# Patient Record
Sex: Male | Born: 1980 | Race: White | Hispanic: No | Marital: Married | Smoking: Never smoker
Health system: Southern US, Community
[De-identification: ages and names within clinical notes are randomized; demographics above are authoritative.]

## PROBLEM LIST (undated history)

## (undated) HISTORY — PX: WISDOM TOOTH EXTRACTION: SHX21

## (undated) HISTORY — PX: CHOLESTEATOMA EXCISION: SHX1345

---

## 2012-11-15 ENCOUNTER — Other Ambulatory Visit: Payer: Self-pay | Admitting: Family Medicine

## 2012-11-15 DIAGNOSIS — M542 Cervicalgia: Secondary | ICD-10-CM

## 2012-11-16 ENCOUNTER — Ambulatory Visit
Admission: RE | Admit: 2012-11-16 | Discharge: 2012-11-16 | Disposition: A | Discharge status: Home / Self Care | Payer: BC Managed Care – PPO | Source: Ambulatory Visit | Attending: Family Medicine | Admitting: Family Medicine

## 2012-11-16 DIAGNOSIS — M542 Cervicalgia: Secondary | ICD-10-CM

## 2012-11-24 ENCOUNTER — Encounter
Payer: BC Managed Care – PPO | Attending: Physical Medicine & Rehabilitation | Admitting: Physical Medicine & Rehabilitation

## 2012-11-24 ENCOUNTER — Encounter: Payer: Self-pay | Admitting: Physical Medicine & Rehabilitation

## 2012-11-24 VITALS — BP 112/76 | HR 69 | Resp 14 | Ht 67.0 in | Wt 228.0 lb

## 2012-11-24 DIAGNOSIS — IMO0001 Reserved for inherently not codable concepts without codable children: Secondary | ICD-10-CM

## 2012-11-24 DIAGNOSIS — G4700 Insomnia, unspecified: Secondary | ICD-10-CM | POA: Insufficient documentation

## 2012-11-24 DIAGNOSIS — R52 Pain, unspecified: Secondary | ICD-10-CM | POA: Insufficient documentation

## 2012-11-24 DIAGNOSIS — M62838 Other muscle spasm: Secondary | ICD-10-CM | POA: Insufficient documentation

## 2012-11-24 DIAGNOSIS — Z5181 Encounter for therapeutic drug level monitoring: Secondary | ICD-10-CM

## 2012-11-24 DIAGNOSIS — R5381 Other malaise: Secondary | ICD-10-CM

## 2012-11-24 DIAGNOSIS — Q232 Congenital mitral stenosis: Secondary | ICD-10-CM

## 2012-11-24 DIAGNOSIS — R531 Weakness: Secondary | ICD-10-CM

## 2012-11-24 DIAGNOSIS — R209 Unspecified disturbances of skin sensation: Secondary | ICD-10-CM | POA: Insufficient documentation

## 2012-11-24 DIAGNOSIS — M797 Fibromyalgia: Secondary | ICD-10-CM

## 2012-11-24 MED ORDER — METHOCARBAMOL 500 MG PO TABS: 500.0000 mg | ORAL_TABLET | Freq: Four times a day (QID) | ORAL | Status: AC | PRN

## 2012-11-24 NOTE — Patient Instructions (Addendum)
PLEASE TRY MELATONIN FOR SLEEP----3-10MG  BEFORE SLEEP   PLEASE TRY DHEA---25MG  CAPSULE--TAKE EACH MORNING   WORK ON BASIC STRETCHING AND AEROBIC EXERCISE.

## 2012-11-24 NOTE — Progress Notes (Signed)
Subjective:    Patient ID: Dale Gonzales, male    DOB: 10-30-1981, 31 y.o.   MRN: [id number]  HPI This is a pleasant 31 yo WM who notes that he's had pain in various areas of body, probably dating back to 2004.  He has noted at times "knots". He has also noted numbness in his hands as well. The duration of symptoms varies. The pain has lasted from minutes to a few days. The numbness has lasted only a few minutes at a time. He also has noticed spasms as well occuring in the arms, legs, chest, and even the face. The spasms has varied in intensity over the years lasting from seconds to minutes although he had one over the weekend in his right quad which lasted for 3 hours. There is no particular time of day the spasms or these pain symptoms present. He is not taking any medications or using any modalities. He usually just "waits these out."  He was hospitalized earlier this month with weakness, numbness in his left arm and face. He also reports a decrease in taste. Work up was normal. An MRI was performed and was normal. i reviewed the MRI personally today. He also had normal carotid dopplers recently.  He has multiple cholesteatomas requiring numerous surgeries, the last of which on his left ear, performed on 11/19. He has had hearing loss as a result. He has had on occasion vestibular sx.  He has had problems with sleep for some time. He can't attribute it to anything specifically--sometimes it's pain, sometimes he's restless. He's lucky if he gets more than 4 hours per night.  He is only taking benadryl to help with congestion in his ears.  Exercise is minimal. 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 General activity 3 Relation with others 3 Enjoyment of life 3 What TIME of day is  your pain at its worst? morning and night Sleep (in general) Fair  Pain is worse with: unknown Pain improves with: over time Relief from Meds: 3  Mobility walk without assistance how many minutes can you walk? varies ability to climb steps?  yes do you drive?  yes Do you have any goals in this area?  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  no  Neuro/Psych bladder control problems weakness numbness tingling trouble walking spasms dizziness depression anxiety loss of taste or smell  Prior Studies n/a  Physicians involved in your care n/a   Family History  Problem Relation Age of Onset  . Mental illness Mother   . Mental illness Father    History   Social History  . Marital Status: Married    Spouse Name: N/A    Number of Children: N/A  . Years of Education: N/A   Social History Main Topics  . Smoking status: Never Smoker   . Smokeless tobacco: Never Used  . Alcohol Use: Yes  . Drug Use: No  . Sexually Active: None   Other Topics Concern  . None   Social History Narrative  . None   Past Surgical History  Procedure Date  . Wisdom tooth extraction   . Cholesteatoma excision    History reviewed. No pertinent  past medical history. BP 112/76  Pulse 69  Resp 14  Ht 5\' 7"  (1.702 m)  Wt 228 lb (103.42 kg)  BMI 35.71 kg/m2  SpO2 96%     Review of Systems  Musculoskeletal: Positive for gait problem.  Neurological: Positive for dizziness, weakness and numbness.  Psychiatric/Behavioral: Positive for dysphoric mood. The patient is nervous/anxious.   All other systems reviewed and are negative.       Objective:   Physical Exam  General: Alert and oriented x 3, No apparent distress, slightly overweight HEENT: Head is normocephalic, atraumatic, PERRLA, EOMI, sclera anicteric, oral mucosa pink and moist, dentition intact, ext ear canals clear,  Neck: Supple without JVD or lymphadenopathy Heart:  Reg rate and rhythm. No murmurs rubs or gallops Chest: CTA bilaterally without wheezes, rales, or rhonchi; no distress Abdomen: Soft, non-tender, non-distended, bowel sounds positive. Extremities: No clubbing, cyanosis, or edema. Pulses are 2+ Skin: Clean and intact without signs of breakdown Neuro: Pt is cognitively appropriate with normal insight, memory, and awareness. Cranial nerves 2-12 are intact except for hearing which was severely impaired on the left and mildly imparied on the right.he may have had a beat or 2 of nystagmus with right gaze. Sensory exam is normal to PP and LT.  Reflexes are 2+ in all 4's. Fine motor coordination is intact although he did have mild difficulty with heel to toe.. No tremors. Motor function is grossly 5/5. Musculoskeletal: He had some tightness diffusely in the lumbar paraspinals and low back as well as the cervical/shoulder girdle area. He had tenderness at the right greater than left elbow, over the clavicles, bilateral traps, medial femoral condyles. No joint swelling was appreciated. Posture was fair. He could not touch his toes.   Psych: Pt's affect is bright and appropriate. Pt is cooperative.        Assessment & Plan:  1. Diffuse body pain with muscle spasms and paresthesias, insomnia--most consistent with fibromyalgia: - recommended MRI of the cervical spine to rule out any further central causes of symptomatolgy although I don't feel this is MS -trial of melatonin for sleep (3-10mg qhs)--consider trazodone, i feel this is his most important problem -robaxin for muscle spasms 500mg  q6 prn -aerobic exercises are paramount. Also discussed regular stretching from head to toe. I think PT would be ideal for this, but I'm not sure his schedule will accommodate. I did provide him basic pilates exercises as a start. Recommended aerobic acitivity at least 3 days per week -follow up with me in 6 weeks.

## 2012-12-06 ENCOUNTER — Telehealth: Payer: Self-pay | Admitting: Physical Medicine & Rehabilitation

## 2012-12-22 ENCOUNTER — Encounter: Payer: BC Managed Care – PPO | Admitting: Physical Medicine & Rehabilitation

## 2013-01-05 ENCOUNTER — Ambulatory Visit: Payer: BC Managed Care – PPO | Admitting: Physical Medicine & Rehabilitation

## 2014-04-19 IMAGING — US US CAROTID DUPLEX BILAT
1 series · 13 of 24 positions shown · non-contrast
Comparison: None available

CLINICAL DATA: Syncope, bilateral neck pain right greater than left

BILATERAL CAROTID DUPLEX ULTRASOUND
TECHNIQUE: Gray scale imaging, color Doppler and duplex ultrasound
was performed of bilateral carotid and vertebral arteries in the
neck.

[Series 1: us carotid duplex bilat · 0.07mm/px · 13 of 54 slices shown]
[im 1/54]
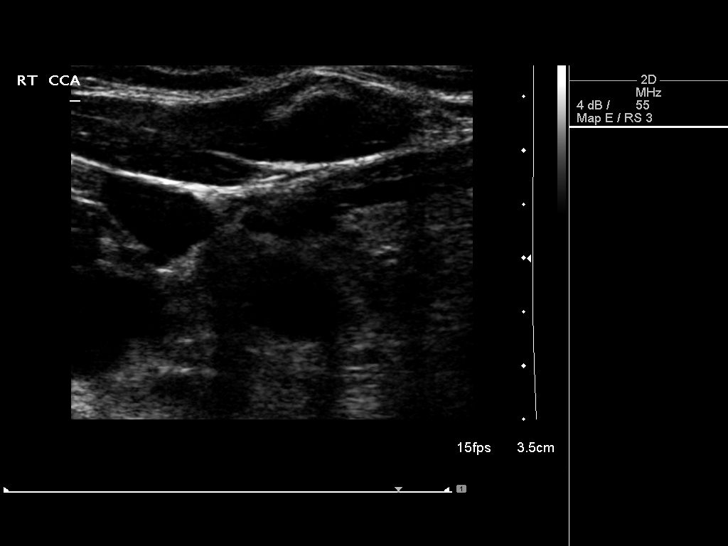
[im 5/54]
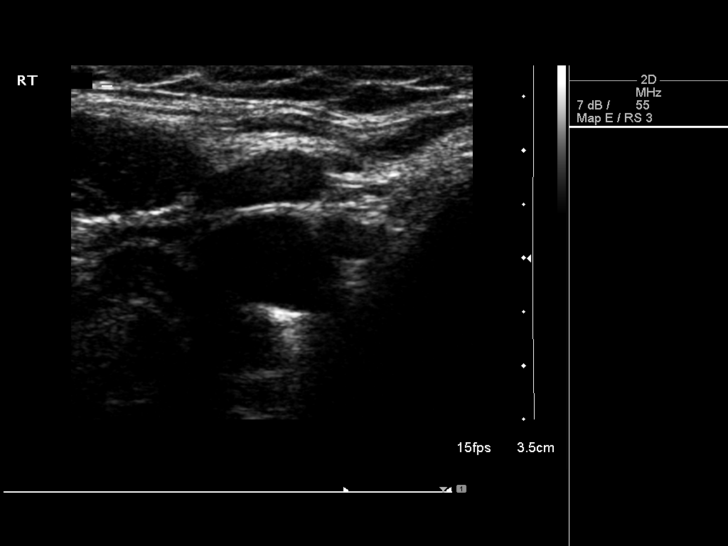
[im 10/54]
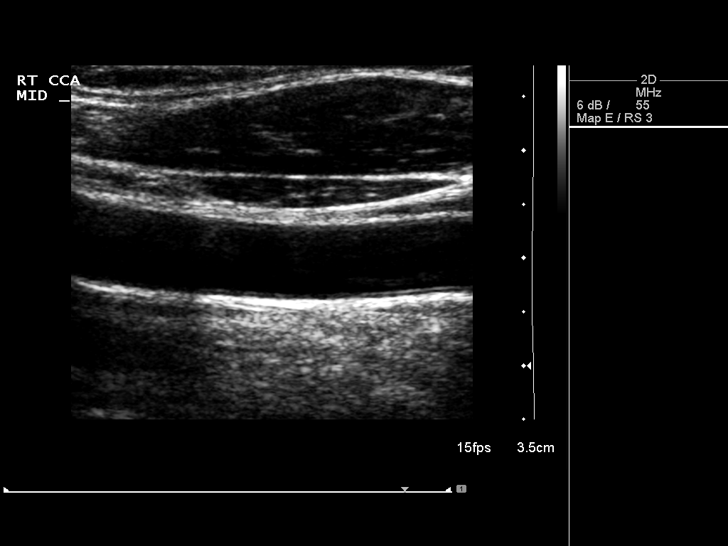
[im 14/54]
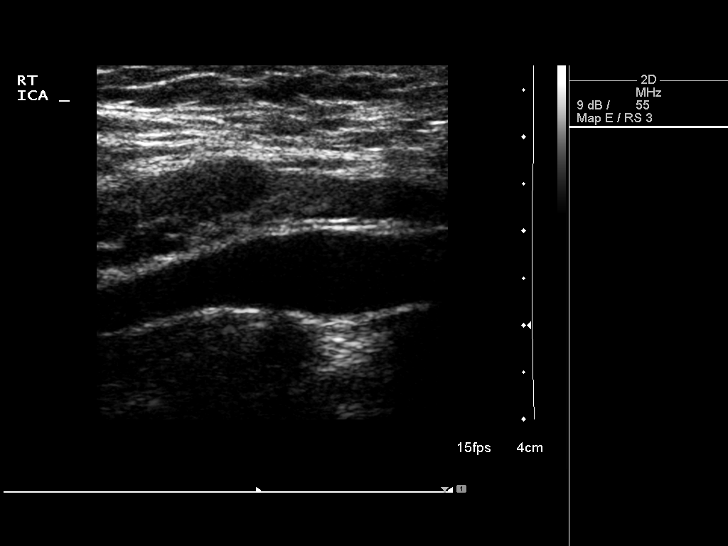
[im 19/54]
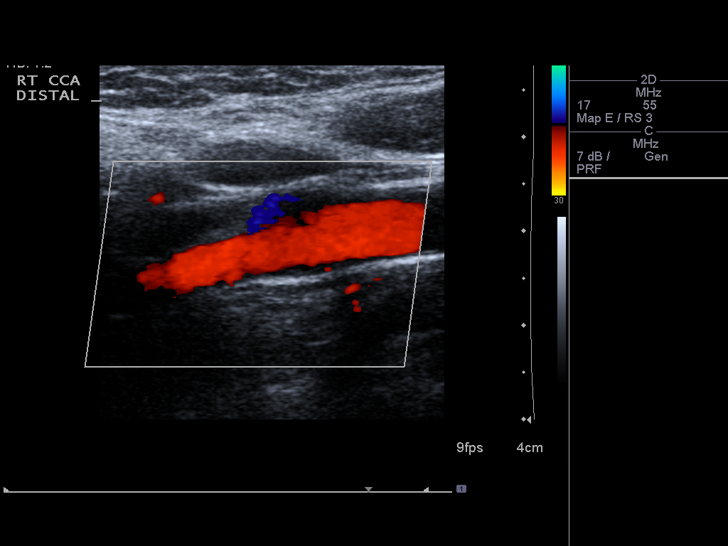
[im 24/54]
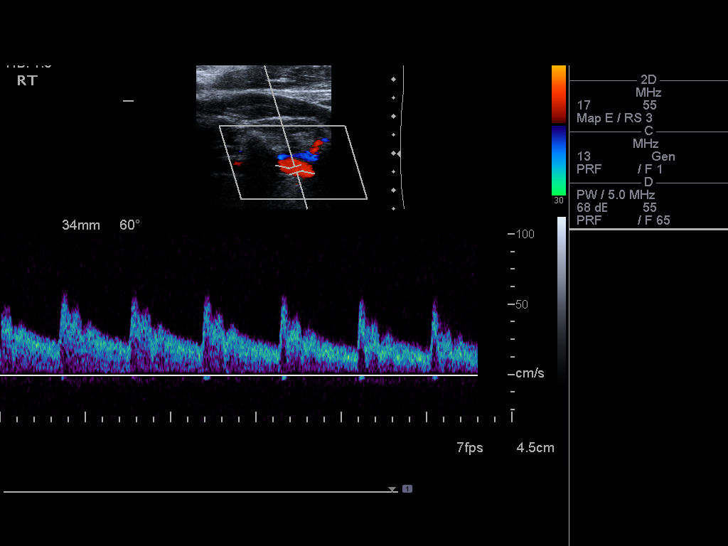
[im 28/54]
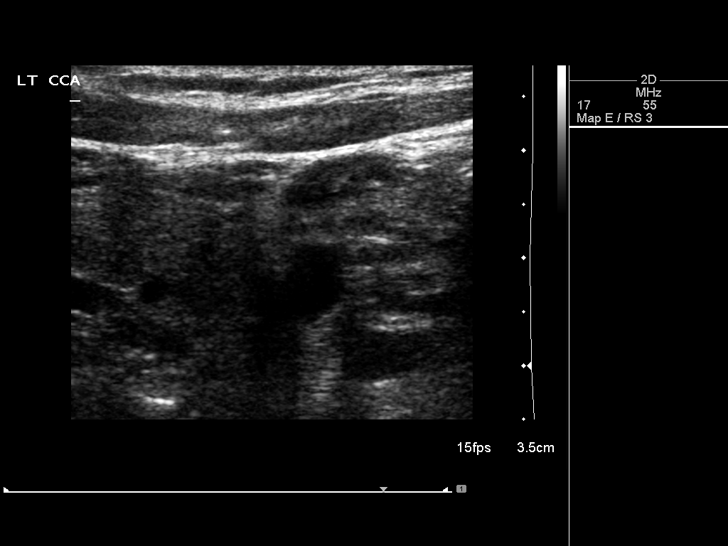
[im 30/54]
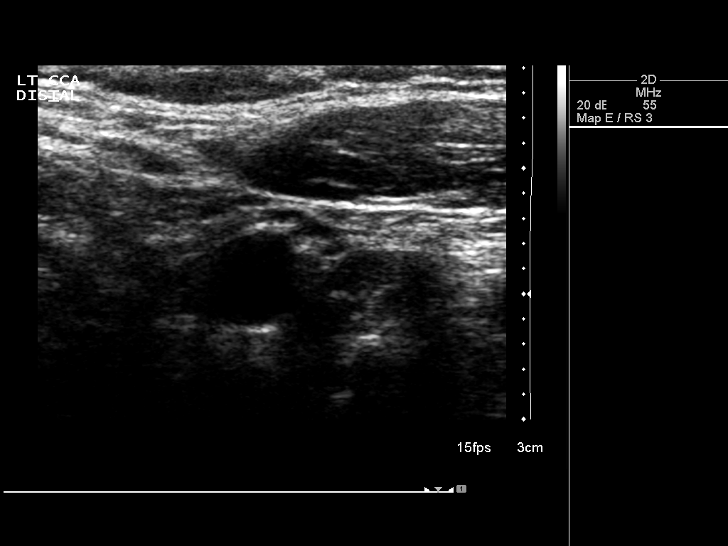
[im 35/54]
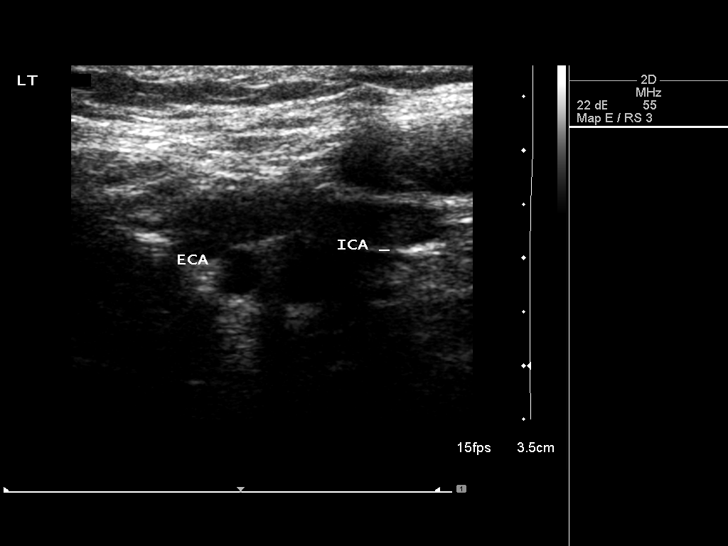
[im 40/54]
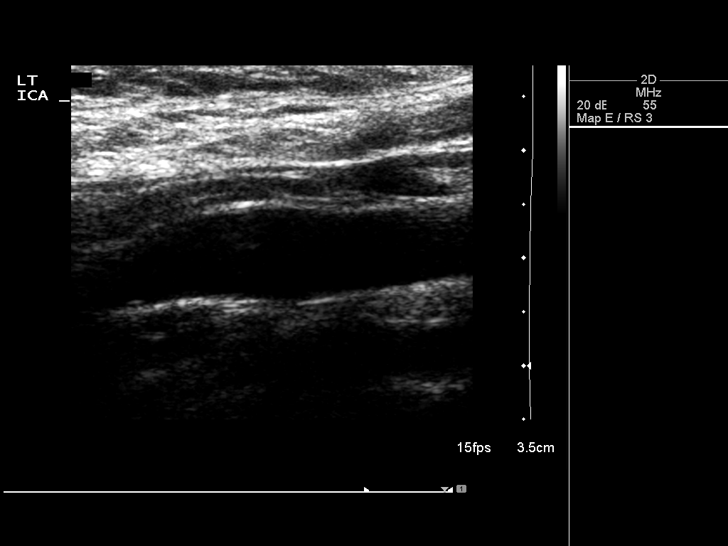
[im 44/54]
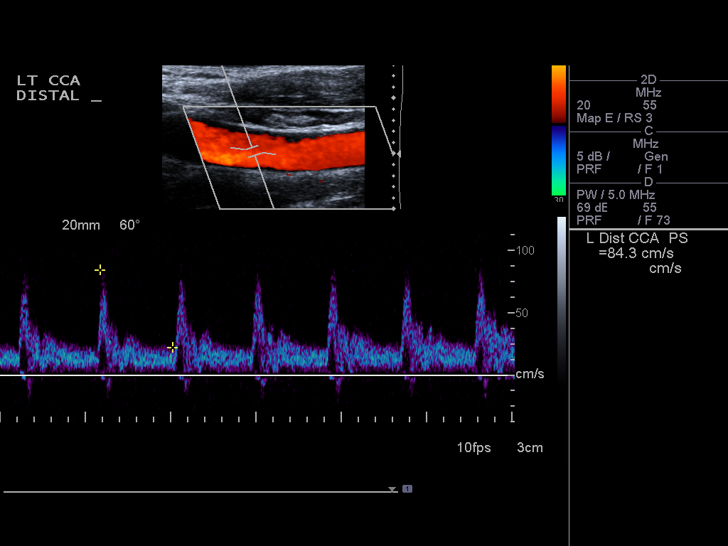
[im 49/54]
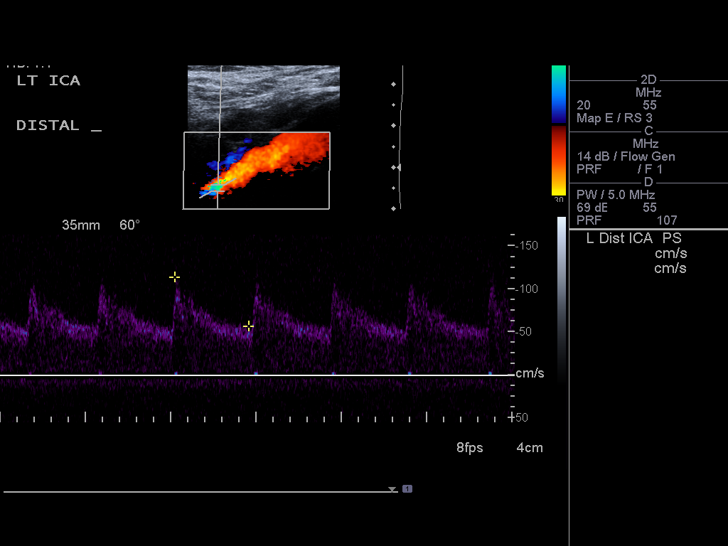
[im 54/54]
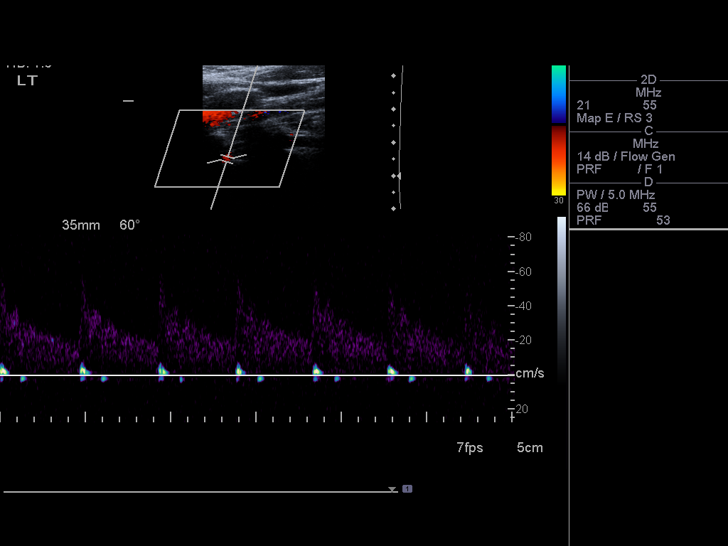

[13 of 24 positions shown; findings below may reference images not displayed]

Criteria:  Quantification of carotid stenosis is based on velocity
parameters that correlate the residual internal carotid diameter
with NASCET-based stenosis levels, using the diameter of the distal
internal carotid lumen as the denominator for stenosis measurement.

The following velocity measurements were obtained:

                 PEAK SYSTOLIC/END DIASTOLIC
RIGHT
ICA:                        105/35cm/sec
CCA:                        125/31cm/sec
SYSTOLIC ICA/CCA RATIO:
DIASTOLIC ICA/CCA RATIO:
ECA:                        98cm/sec

LEFT
ICA:                        114/57cm/sec
CCA:                        138/35cm/sec
SYSTOLIC ICA/CCA RATIO:
DIASTOLIC ICA/CCA RATIO:
ECA:                        112cm/sec
FINDINGS: RIGHT CAROTID ARTERY: No significant plaque accumulation or
stenosis.  Normal wave forms and color Doppler signal.

RIGHT VERTEBRAL ARTERY:  Normal flow direction and waveform.

LEFT CAROTID ARTERY: No significant plaque or stenosis.  Normal
wave forms and color Doppler signal.

LEFT VERTEBRAL ARTERY:  Normal flow direction and waveform.
IMPRESSION: Negative

## 2015-01-28 NOTE — Telephone Encounter (Signed)
na
